# Patient Record
Sex: Male | Born: 1975 | Race: White | Hispanic: No | Marital: Married | Smoking: Former smoker
Health system: Southern US, Community
[De-identification: ages and names within clinical notes are randomized; demographics above are authoritative.]

---

## 2014-05-29 ENCOUNTER — Encounter (HOSPITAL_COMMUNITY): Payer: Self-pay

## 2014-05-29 ENCOUNTER — Emergency Department (HOSPITAL_COMMUNITY)
Admission: EM | Admit: 2014-05-29 | Discharge: 2014-05-29 | Disposition: A | Discharge status: Home / Self Care | Payer: Commercial Managed Care - PPO | Attending: Emergency Medicine | Admitting: Emergency Medicine

## 2014-05-29 ENCOUNTER — Emergency Department (HOSPITAL_COMMUNITY): Payer: Commercial Managed Care - PPO

## 2014-05-29 DIAGNOSIS — Y9289 Other specified places as the place of occurrence of the external cause: Secondary | ICD-10-CM | POA: Insufficient documentation

## 2014-05-29 DIAGNOSIS — Y998 Other external cause status: Secondary | ICD-10-CM | POA: Diagnosis not present

## 2014-05-29 DIAGNOSIS — Z87891 Personal history of nicotine dependence: Secondary | ICD-10-CM | POA: Diagnosis not present

## 2014-05-29 DIAGNOSIS — Y9389 Activity, other specified: Secondary | ICD-10-CM | POA: Diagnosis not present

## 2014-05-29 DIAGNOSIS — S42102A Fracture of unspecified part of scapula, left shoulder, initial encounter for closed fracture: Secondary | ICD-10-CM

## 2014-05-29 DIAGNOSIS — S4992XA Unspecified injury of left shoulder and upper arm, initial encounter: Secondary | ICD-10-CM | POA: Diagnosis present

## 2014-05-29 DIAGNOSIS — S0012XA Contusion of left eyelid and periocular area, initial encounter: Secondary | ICD-10-CM | POA: Diagnosis not present

## 2014-05-29 DIAGNOSIS — S42112A Displaced fracture of body of scapula, left shoulder, initial encounter for closed fracture: Secondary | ICD-10-CM | POA: Diagnosis not present

## 2014-05-29 MED ORDER — HYDROMORPHONE HCL 1 MG/ML IJ SOLN
1.0000 mg | Freq: Once | INTRAMUSCULAR | Status: AC
  Administered 2014-05-29: 1 mg via INTRAMUSCULAR
  Filled 2014-05-29: qty 1

## 2014-05-29 MED ORDER — ONDANSETRON 4 MG PO TBDP
4.0000 mg | ORAL_TABLET | Freq: Once | ORAL | Status: AC
  Administered 2014-05-29: 4 mg via ORAL
  Filled 2014-05-29: qty 1

## 2014-05-29 MED ORDER — OXYCODONE-ACETAMINOPHEN 5-325 MG PO TABS: 2.0000 | ORAL_TABLET | ORAL | Status: AC | PRN

## 2014-05-29 MED ORDER — ONDANSETRON HCL 4 MG PO TABS: 4.0000 mg | ORAL_TABLET | Freq: Four times a day (QID) | ORAL | Status: AC
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
## 2014-05-29 NOTE — ED Notes (Signed)
Abrasion over left eyebrow.
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
## 2014-05-29 NOTE — Discharge Instructions (Signed)
Scapular Fracture °You have a fracture (break in bone) of your scapula. This is your shoulder blade. It is the large flat bone behind your shoulder. This is also the bone that makes up the ball and socket joint of your shoulder. Most of the time surgery is not required for injuries to this bone unless the socket of the shoulder joint is involved. °DIAGNOSIS  °Because of the severity of force usually required to break this bone, x-rays are often taken of other bones likely to be injured at the same time. X-rays of the hip, knee, and pelvis may be taken. Specialized x-rays (arteriograms) may be needed if there are injuries to large blood vessels associated with this injury. °HOME CARE INSTRUCTIONS  °· Simple fractures of the scapula can be treated with a sling and swathe type of immobilization. This means the involved area is held in place by putting the arm in a sling. A wrap is made around the upper arm with the sling holding the arm next to the chest. This may be removed for bathing as instructed by your caregiver. °· Apply ice to the injury for 15-20 minutes 03-04 times per day. Put the ice in a plastic bag. Place a towel between the bag of ice and your skin, splint, or immobilization device. °· Do not resume use until instructed by your caregiver. Usually full rehabilitation (exercises to improve the injury site) will begin sometime after the sling and swathe are removed. Then begin use gradually as directed. Do not increase use to the point of pain. If pain develops, decrease use and continue the above measures. Slowly increase activities that do not cause discomfort until you gradually achieve normal use without pain. °· Only take over-the-counter or prescription medicines for pain, discomfort, or fever as directed by your caregiver. °SEEK IMMEDIATE MEDICAL CARE IF:  °· Your pain and swelling increase and is uncontrolled with medications. °· You develop new, unexplained symptoms or an increase of the symptoms  which brought you to your caregiver. °· You develop shortness or breath or cough up blood. °· You are unable to move your arm or fingers. You develop warmth and swelling in your affected arm. °· You develop an unexplained temperature. °Document Released: 01/14/2005 Document Revised: 04/08/2011 Document Reviewed: 12/06/2005 °[employer]® Patient Information ©2015 [employer]. This information is not intended to replace advice given to you by your health care provider. Make sure you discuss any questions you have with your health care provider. ° °
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
## 2018-01-12 DIAGNOSIS — Z0000 Encounter for general adult medical examination without abnormal findings: Secondary | ICD-10-CM | POA: Diagnosis not present

## 2018-01-12 DIAGNOSIS — Z1389 Encounter for screening for other disorder: Secondary | ICD-10-CM | POA: Diagnosis not present

## 2018-01-12 DIAGNOSIS — E6609 Other obesity due to excess calories: Secondary | ICD-10-CM | POA: Diagnosis not present

## 2018-01-12 DIAGNOSIS — Z6831 Body mass index (BMI) 31.0-31.9, adult: Secondary | ICD-10-CM | POA: Diagnosis not present

## 2018-02-24 DIAGNOSIS — R6889 Other general symptoms and signs: Secondary | ICD-10-CM | POA: Diagnosis not present

## 2018-02-24 DIAGNOSIS — J22 Unspecified acute lower respiratory infection: Secondary | ICD-10-CM | POA: Diagnosis not present

## 2018-02-24 DIAGNOSIS — B349 Viral infection, unspecified: Secondary | ICD-10-CM | POA: Diagnosis not present

## 2018-02-24 DIAGNOSIS — J209 Acute bronchitis, unspecified: Secondary | ICD-10-CM | POA: Diagnosis not present

## 2018-02-24 DIAGNOSIS — Z1389 Encounter for screening for other disorder: Secondary | ICD-10-CM | POA: Diagnosis not present

## 2021-09-30 ENCOUNTER — Encounter (HOSPITAL_COMMUNITY): Payer: Self-pay | Admitting: *Deleted

## 2021-09-30 ENCOUNTER — Emergency Department (HOSPITAL_COMMUNITY): Payer: BC Managed Care – PPO

## 2021-09-30 ENCOUNTER — Emergency Department (HOSPITAL_COMMUNITY)
Admission: EM | Admit: 2021-09-30 | Discharge: 2021-09-30 | Disposition: A | Discharge status: Home / Self Care | Payer: BC Managed Care – PPO | Attending: Emergency Medicine | Admitting: Emergency Medicine

## 2021-09-30 ENCOUNTER — Other Ambulatory Visit: Payer: Self-pay

## 2021-09-30 DIAGNOSIS — S61210A Laceration without foreign body of right index finger without damage to nail, initial encounter: Secondary | ICD-10-CM | POA: Insufficient documentation

## 2021-09-30 DIAGNOSIS — Z23 Encounter for immunization: Secondary | ICD-10-CM | POA: Diagnosis not present

## 2021-09-30 DIAGNOSIS — W11XXXA Fall on and from ladder, initial encounter: Secondary | ICD-10-CM | POA: Diagnosis not present

## 2021-09-30 DIAGNOSIS — S6991XA Unspecified injury of right wrist, hand and finger(s), initial encounter: Secondary | ICD-10-CM | POA: Diagnosis present

## 2021-09-30 MED ORDER — IBUPROFEN 400 MG PO TABS
600.0000 mg | ORAL_TABLET | Freq: Once | ORAL | Status: AC
  Administered 2021-09-30: 600 mg via ORAL
  Filled 2021-09-30: qty 2

## 2021-09-30 MED ORDER — BACITRACIN ZINC 500 UNIT/GM EX OINT
TOPICAL_OINTMENT | Freq: Once | CUTANEOUS | Status: AC
  Administered 2021-09-30: 3 via TOPICAL
  Filled 2021-09-30: qty 2.7

## 2021-09-30 MED ORDER — LIDOCAINE HCL (PF) 1 % IJ SOLN
30.0000 mL | Freq: Once | INTRAMUSCULAR | Status: AC
  Administered 2021-09-30: 30 mL
  Filled 2021-09-30: qty 30

## 2021-09-30 MED ORDER — TETANUS-DIPHTH-ACELL PERTUSSIS 5-2.5-18.5 LF-MCG/0.5 IM SUSY
0.5000 mL | PREFILLED_SYRINGE | Freq: Once | INTRAMUSCULAR | Status: AC
  Administered 2021-09-30: 0.5 mL via INTRAMUSCULAR
  Filled 2021-09-30: qty 0.5

## 2021-09-30 NOTE — ED Triage Notes (Signed)
Pt with lac to right index finger after pt slide down the ladder after slipping.  Unknown of last tetanus shot.

## 2021-09-30 NOTE — ED Provider Notes (Signed)
[facility] EMERGENCY DEPARTMENT Provider Note   CSN: [id number] Arrival date & time: 09/30/21  1307     History  Chief Complaint  Patient presents with   Laceration    Christopher Newton is a 46 y.o. male.  With no significant past medical history presents to the emergency department with laceration.  Just prior to arrival patient states that he was hanging a light fixture in his house when he slipped off the ladder.  He states that he attempted to grab the ladder with his hands and in doing so lacerated his right index finger.  Unknown last tetanus.  Not on anticoagulation.  Denies hitting his head or loss of consciousness.   Laceration      Home Medications Prior to Admission medications   Medication Sig Start Date End Date Taking? Authorizing Provider  ondansetron (ZOFRAN) 4 MG tablet Take 1 tablet (4 mg total) by mouth every 6 (six) hours. 05/29/14   Patel-Mills, Lorelle Formosa, PA-C  oxyCODONE-acetaminophen (PERCOCET/ROXICET) 5-325 MG per tablet Take 2 tablets by mouth every 4 (four) hours as needed for severe pain. 05/29/14   Patel-Mills, Lorelle Formosa, PA-C      Allergies    Patient has no known allergies.    Review of Systems   Review of Systems  Skin:  Positive for wound.  All other systems reviewed and are negative.   Physical Exam Updated Vital Signs BP (!) 123/108 (BP Location: Left Arm)   Pulse 78   Temp 98.1 F (36.7 C) (Oral)   Resp 16   Ht 5\' 11"  (1.803 m)   Wt 95.3 kg   SpO2 98%   BMI 29.29 kg/m  Physical Exam Vitals and nursing note reviewed.  HENT:     Head: Normocephalic and atraumatic.  Eyes:     General: No scleral icterus. Pulmonary:     Effort: Pulmonary effort is normal. No respiratory distress.  Skin:    Findings: No rash.          Comments: 1 to 2 cm semicircular laceration to the volar aspect of the right index finger.  Hemostatic.  Neurological:     General: No focal deficit present.     Mental Status: He is alert.  Psychiatric:        Mood  and Affect: Mood normal.        Behavior: Behavior normal.        Thought Content: Thought content normal.        Judgment: Judgment normal.     ED Results / Procedures / Treatments   Labs (all labs ordered are listed, but only abnormal results are displayed) Labs Reviewed - No data to display  EKG None  Radiology DG Finger Index Right  Result Date: 09/30/2021 CLINICAL DATA:  Right index finger laceration. EXAM: RIGHT INDEX FINGER 2+V COMPARISON:  None Available. FINDINGS: There is no evidence of fracture or dislocation. There is no evidence of arthropathy or other focal bone abnormality. Soft tissues are unremarkable. IMPRESSION: Negative. Electronically Signed   By: 11/30/2021 M.D.   On: 09/30/2021 14:24    Procedures .11/03/2023Laceration Repair  Date/Time: 09/30/2021 2:58 PM  Performed by: 11/30/2021, PA-C Authorized by: Cristopher Peru, PA-C   Consent:    Consent obtained:  Verbal   Consent given by:  Patient   Risks, benefits, and alternatives were discussed: yes     Risks discussed:  Poor cosmetic result, pain, retained foreign body, need for additional repair, infection, tendon damage, poor  wound healing and nerve damage   Alternatives discussed:  No treatment Universal protocol:    Procedure explained and questions answered to patient or proxy's satisfaction: yes     Relevant documents present and verified: yes     Test results available: yes     Imaging studies available: yes     Required blood products, implants, devices, and special equipment available: yes     Site/side marked: yes     Immediately prior to procedure, a time out was called: yes     Patient identity confirmed:  Verbally with patient Anesthesia:    Anesthesia method:  Local infiltration   Local anesthetic:  Lidocaine 1% w/o epi Laceration details:    Location:  Finger   Finger location:  R index finger   Length (cm):  2   Depth (mm):  3 Pre-procedure details:    Preparation:  Patient was  prepped and draped in usual sterile fashion and imaging obtained to evaluate for foreign bodies Exploration:    Limited defect created (wound extended): no     Hemostasis achieved with:  Tourniquet   Imaging obtained: x-ray     Imaging outcome: foreign body not noted     Wound exploration: wound explored through full range of motion and entire depth of wound visualized     Wound extent: areolar tissue violated     Contaminated: yes   Treatment:    Area cleansed with:  Saline   Amount of cleaning:  Extensive   Irrigation solution:  Sterile saline   Irrigation volume:  1000   Irrigation method:  Pressure wash   Debridement:  None   Undermining:  Minimal   Scar revision: no   Skin repair:    Repair method:  Sutures   Suture size:  4-0   Suture material:  Prolene   Suture technique:  Simple interrupted   Number of sutures:  5 Approximation:    Approximation:  Close Repair type:    Repair type:  Simple Post-procedure details:    Dressing:  Antibiotic ointment   Procedure completion:  Tolerated well, no immediate complications   Medications Ordered in ED Medications  bacitracin ointment (has no administration in time range)  ibuprofen (ADVIL) tablet 600 mg (600 mg Oral Given 09/30/21 1357)  lidocaine (PF) (XYLOCAINE) 1 % injection 30 mL (30 mLs Infiltration Given by Other 09/30/21 1402)  Tdap (BOOSTRIX) injection 0.5 mL (0.5 mLs Intramuscular Given 09/30/21 1352)    ED Course/ Medical Decision Making/ A&P                           Medical Decision Making Amount and/or Complexity of Data Reviewed Radiology: ordered.  Risk Prescription drug management.   This patient presents to the ED with chief complaint(s) of laceration with pertinent past medical history of none which further complicates the presenting complaint. The complaint involves an extensive differential diagnosis and also carries with it a high risk of complications and morbidity.    The differential diagnosis  includes laceration, underlying foreign body or fracture   Additional history obtained: Additional history obtained from significant other Records reviewed Care Everywhere/External Records  ED Course and Reassessment: 46 year old male presents to the emergency department with laceration to right index finger. Neurovascularly intact. Tetanus updated Imaging to evaluate FB shows no evidence of FB or underlying fracture. Good ROM so doubt tendinous injury.  Laceration cleaned thoroughly and closed as noted in the procedural note. Tolerated  well. No indication for antibiotics at this time.  Given return instructions for 7 days for suture removal. He verbalizes understanding. Safe for discharge  Independent labs interpretation:  The following labs were independently interpreted: n/a  Independent visualization of imaging: - I independently visualized the following imaging with scope of interpretation limited to determining acute life threatening conditions related to emergency care: DG of right index finger, which revealed no foreign body or fracture  Consultation: - Consulted or discussed management/test interpretation w/ external professional: not indicated  Consideration for admission or further workup: not indicated Social Determinants of health: none identified  Final Clinical Impression(s) / ED Diagnoses Final diagnoses:  Laceration of right index finger without foreign body without damage to nail, initial encounter    Rx / DC Orders ED Discharge Orders     None         Cristopher Peru, PA-C 09/30/21 1459    Gloris Manchester, MD 10/02/21 [phone]

## 2021-09-30 NOTE — Discharge Instructions (Addendum)
You were seen in the emergency department today for a laceration to your right index finger.  We had 5 sutures placed in your finger.  You will need to come back in 7 days to have your sutures removed.  I have attached suture care instructions on your discharge paperwork.  But briefly please do not submerge it in water or scrub over the area.  You may use triple antibiotic ointment over it.  Please return for any signs of infection such as heat, redness and puslike drainage coming from the wound.
